# Patient Record
Sex: Male | Born: 1985 | Race: White | Hispanic: No | Marital: Married | Smoking: Current every day smoker
Health system: Southern US, Community
[De-identification: ages and names within clinical notes are randomized; demographics above are authoritative.]

---

## 2004-10-17 ENCOUNTER — Emergency Department: Payer: Self-pay | Admitting: Emergency Medicine

## 2007-11-18 ENCOUNTER — Emergency Department: Payer: Self-pay | Admitting: Emergency Medicine

## 2008-06-15 ENCOUNTER — Emergency Department: Payer: Self-pay | Admitting: Emergency Medicine

## 2009-08-09 IMAGING — CT CT ABD-PELV W/O CM
1 of 2 series · 15 of 32 positions shown, 19 images · non-contrast
Comparison: none

REASON FOR EXAM: (1) left  sided abd pain; (2) left lower quadrant abd
pain
COMMENTS:

[Series 2: stone · axial · 0.82mm/px · z∈[-479,-20]mm · 15 of 167 slices shown, 19 images]
[im 7/167  soft-tissue]
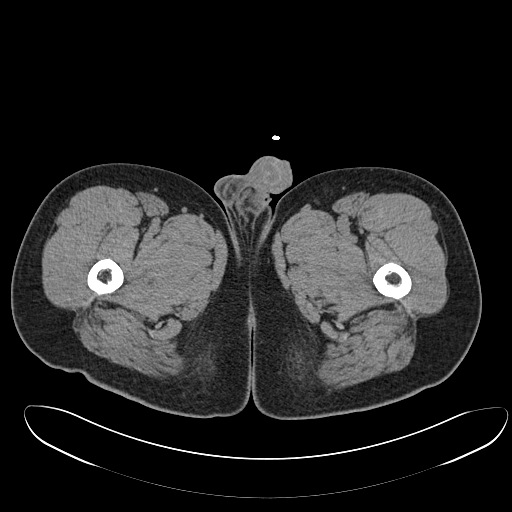
[im 7/167  bone]
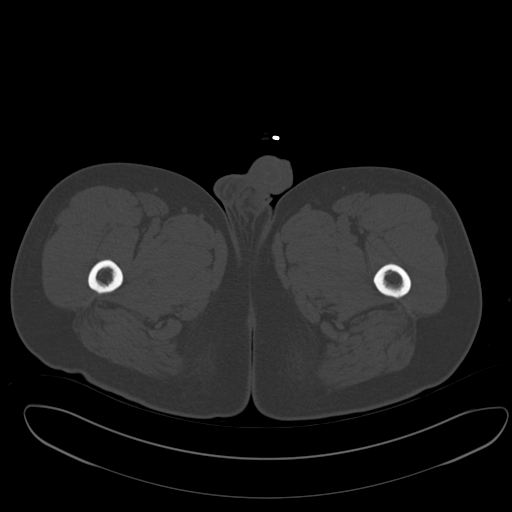
[im 21/167  soft-tissue]
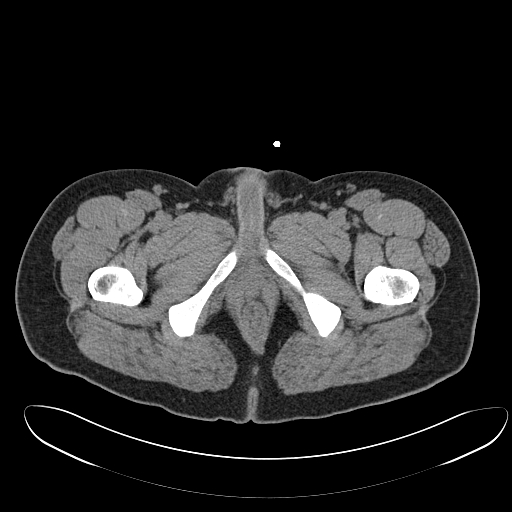
[im 35/167  soft-tissue]
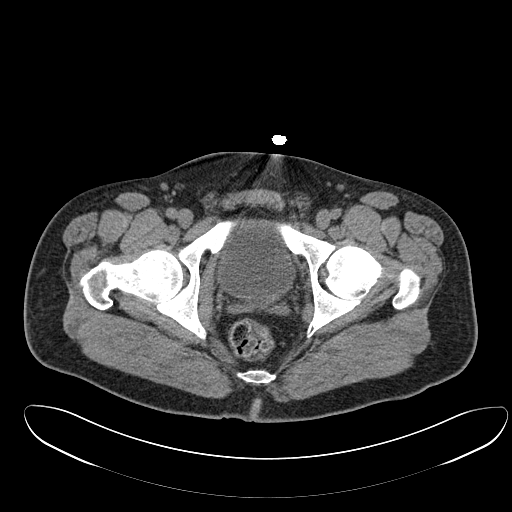
[im 49/167  soft-tissue]
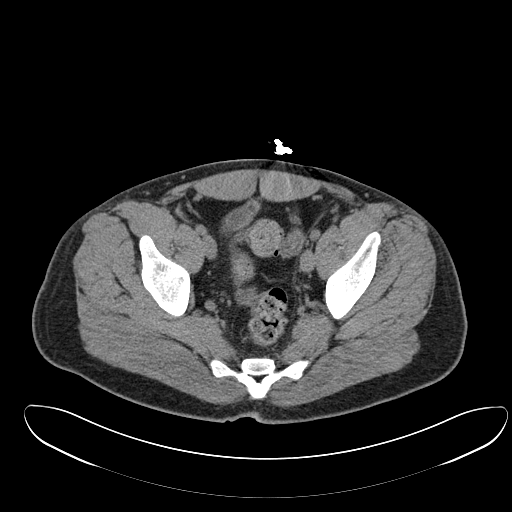
[im 56/167  soft-tissue]
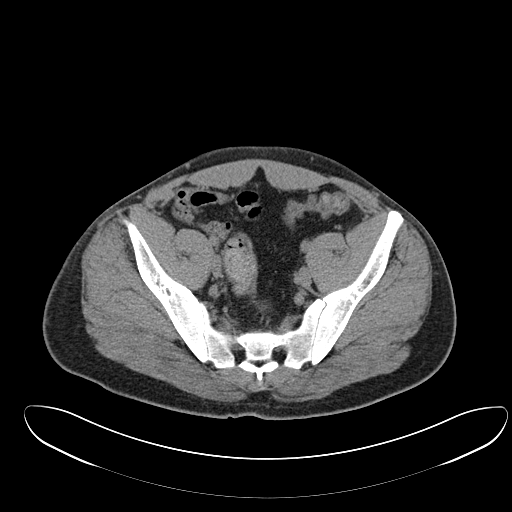
[im 70/167  soft-tissue]
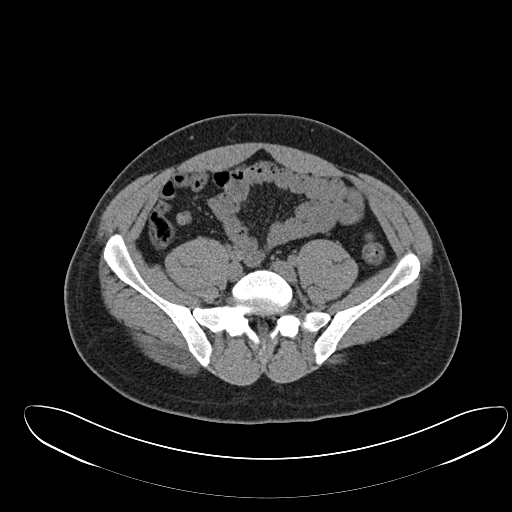
[im 84/167  soft-tissue]
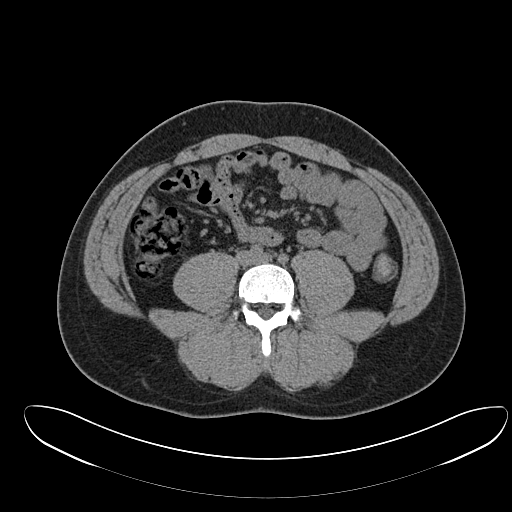
[im 97/167  soft-tissue]
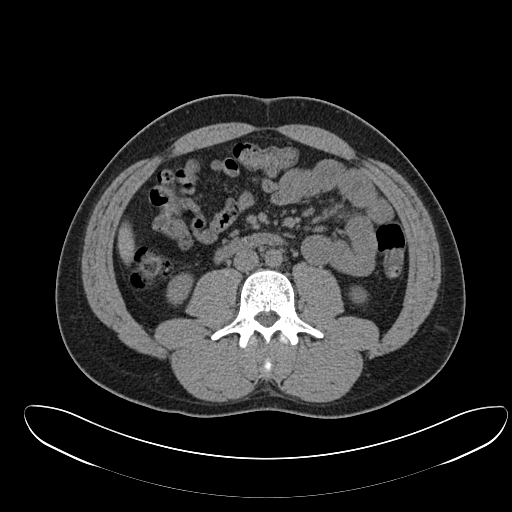
[im 111/167  soft-tissue]
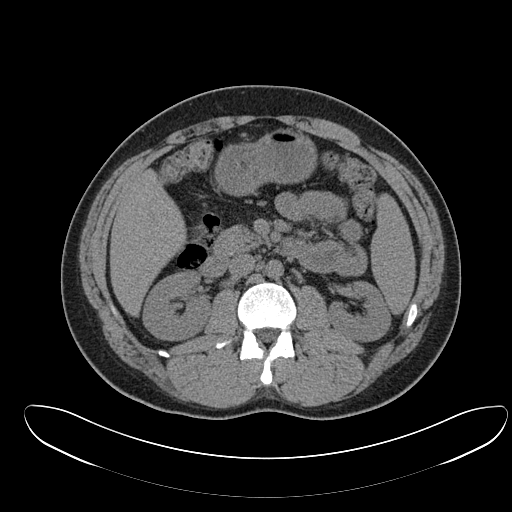
[im 111/167  bone]
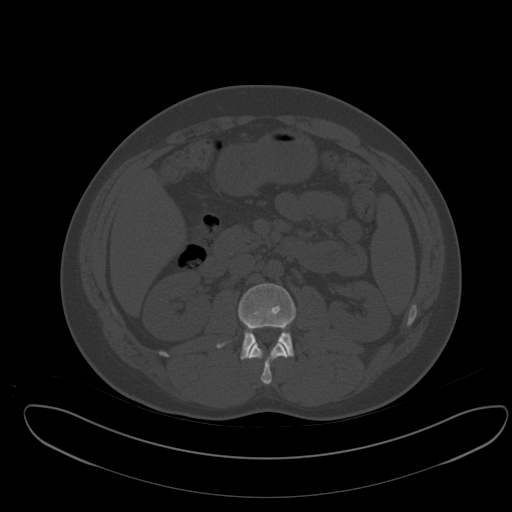
[im 118/167  soft-tissue]
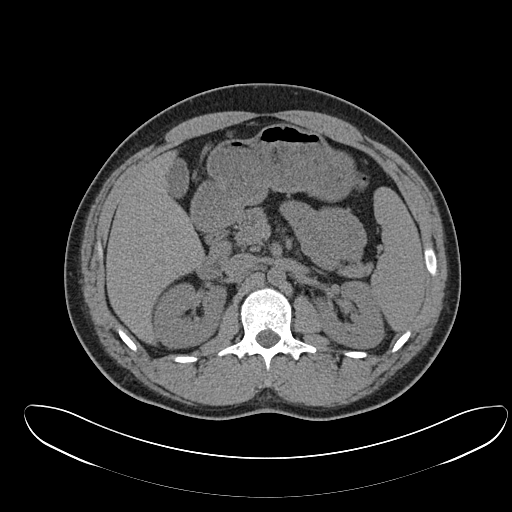
[im 132/167  soft-tissue]
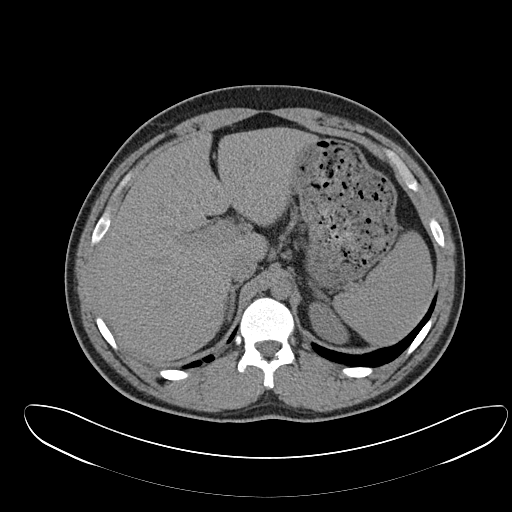
[im 139/167  lung]
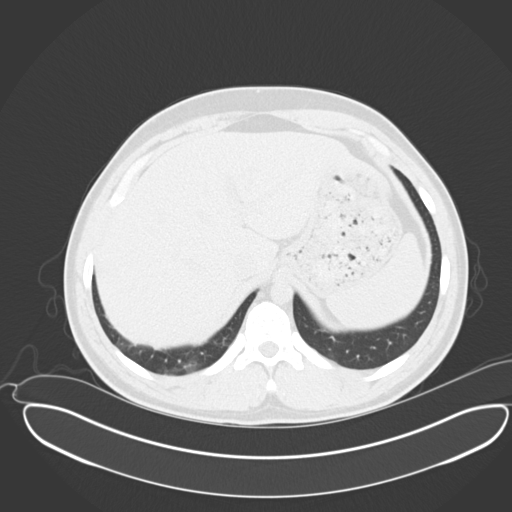
[im 146/167  soft-tissue]
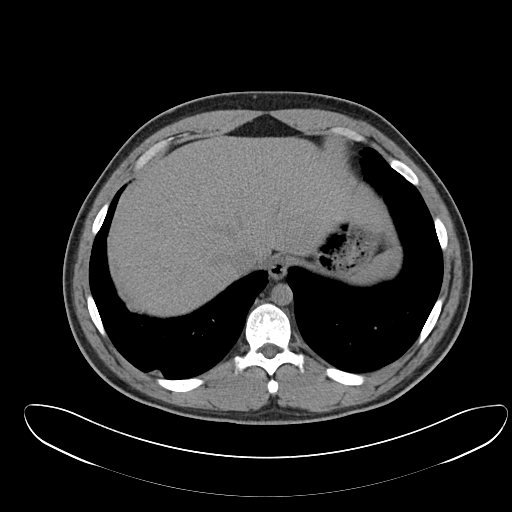
[im 146/167  lung]
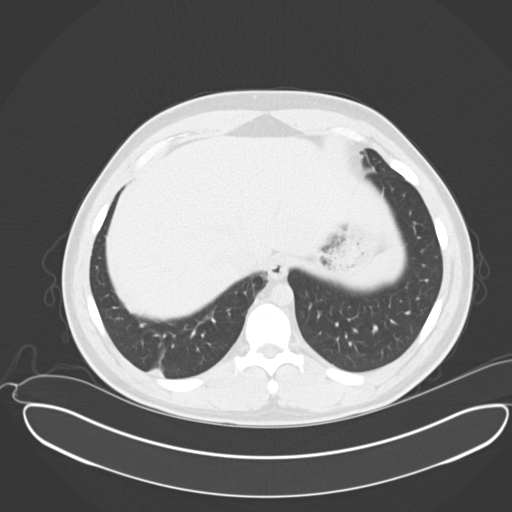
[im 153/167  lung]
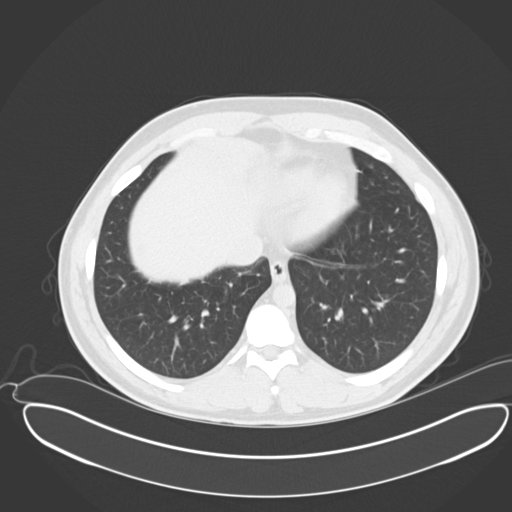
[im 160/167  soft-tissue]
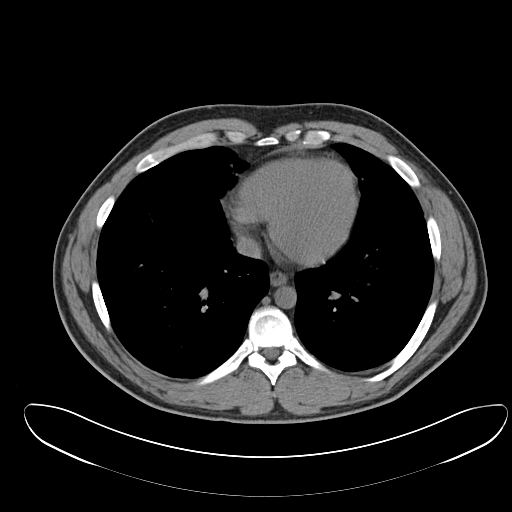
[im 160/167  lung]
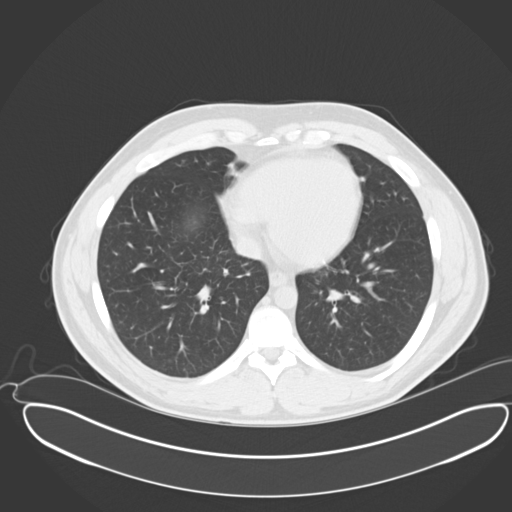

[15 of 32 positions shown; findings below may reference images not displayed]

PROCEDURE:     CT  - CT ABDOMEN AND PELVIS W[DATE]  [DATE]

RESULT:     Helical non-contrast 3 mm sections were obtained from the lung
bases through the pubic symphysis.

Evaluation of the lung bases demonstrates no gross abnormalities. Within the
limitations of a non-contrast CT, the liver, spleen adrenals and pancreas
are unremarkable. Evaluation of the kidneys demonstrates no evidence of
hydronephrosis, hydroureter, ureterolithiasis or nephrolithiasis. Small
subcentimeter mesenteric lymph nodes are identified as well as small
retroperitoneal lymph nodes. There is no CT evidence of bowel obstruction
nor an abdominal aortic aneurysm. The urinary bladder is distended with
urine.
IMPRESSION: 1. No CT evidence of renal calculus disease.

2. Small mesenteric and retroperitoneal lymph nodes which may represent an
element of mesenteric adenitis.

3. No further focal or acute abnormalities.

Dr. Flip of the Emergency Department was informed of these findings via a
preliminary faxed report on 06-15-2008 at [DATE] p.m. Central Standard Time.

## 2010-08-12 ENCOUNTER — Emergency Department: Payer: Self-pay | Admitting: Emergency Medicine

## 2011-06-18 ENCOUNTER — Emergency Department: Payer: Self-pay | Admitting: Emergency Medicine

## 2014-10-09 ENCOUNTER — Emergency Department: Payer: BLUE CROSS/BLUE SHIELD

## 2014-10-09 ENCOUNTER — Emergency Department
Admission: EM | Admit: 2014-10-09 | Discharge: 2014-10-09 | Disposition: A | Discharge status: Home / Self Care | Payer: BLUE CROSS/BLUE SHIELD | Attending: Emergency Medicine | Admitting: Emergency Medicine

## 2014-10-09 ENCOUNTER — Encounter: Payer: Self-pay | Admitting: Emergency Medicine

## 2014-10-09 DIAGNOSIS — Y998 Other external cause status: Secondary | ICD-10-CM | POA: Insufficient documentation

## 2014-10-09 DIAGNOSIS — S4991XA Unspecified injury of right shoulder and upper arm, initial encounter: Secondary | ICD-10-CM | POA: Diagnosis present

## 2014-10-09 DIAGNOSIS — W1849XA Other slipping, tripping and stumbling without falling, initial encounter: Secondary | ICD-10-CM | POA: Diagnosis not present

## 2014-10-09 DIAGNOSIS — Y9289 Other specified places as the place of occurrence of the external cause: Secondary | ICD-10-CM | POA: Insufficient documentation

## 2014-10-09 DIAGNOSIS — Y9301 Activity, walking, marching and hiking: Secondary | ICD-10-CM | POA: Diagnosis not present

## 2014-10-09 DIAGNOSIS — Z720 Tobacco use: Secondary | ICD-10-CM | POA: Insufficient documentation

## 2014-10-09 DIAGNOSIS — Y9389 Activity, other specified: Secondary | ICD-10-CM | POA: Insufficient documentation

## 2014-10-09 DIAGNOSIS — S40021A Contusion of right upper arm, initial encounter: Secondary | ICD-10-CM | POA: Insufficient documentation

## 2014-10-09 MED ORDER — NAPROXEN 500 MG PO TABS: 500.0000 mg | ORAL_TABLET | Freq: Two times a day (BID) | ORAL | Status: AC

## 2014-10-09 NOTE — ED Notes (Signed)
Pt reports slipping on steps last pm, pain to inner right upper arm, contusion noted

## 2014-10-09 NOTE — Discharge Instructions (Signed)
Contusion °A contusion is a deep bruise. Contusions happen when an injury causes bleeding under the skin. Signs of bruising include pain, puffiness (swelling), and discolored skin. The contusion may turn blue, purple, or yellow. °HOME CARE  °· Put ice on the injured area. °¨ Put ice in a plastic bag. °¨ Place a towel between your skin and the bag. °¨ Leave the ice on for 15-20 minutes, 03-04 times a day. °· Only take medicine as told by your doctor. °· Rest the injured area. °· If possible, raise (elevate) the injured area to lessen puffiness. °GET HELP RIGHT AWAY IF:  °· You have more bruising or puffiness. °· You have pain that is getting worse. °· Your puffiness or pain is not helped by medicine. °MAKE SURE YOU:  °· Understand these instructions. °· Will watch your condition. °· Will get help right away if you are not doing well or get worse. °Document Released: 10/26/2007 Document Revised: 08/01/2011 Document Reviewed: 03/14/2011 °Exit[employer]® Patient Information ©2015 [employer]. This information is not intended to replace advice given to you by your health care provider. Make sure you discuss any questions you have with your health care provider. ° °

## 2014-10-09 NOTE — ED Provider Notes (Signed)
[facility] Emergency Department Provider Note  ____________________________________________  Time seen: Approximately 10:08 AM  I have reviewed the triage vital signs and the nursing notes.   HISTORY  Chief Complaint Arm Injury    HPI Dennis Gonzalez is a 29 y.o. male who presents to the emergency department for right upper arm pain after slipping on a wet step last night at his house. He states he was walking his dog went to step up and lost his footing. He denies other injury as well as loss of consciousness.   History reviewed. No pertinent past medical history.  There are no active problems to display for this patient.   History reviewed. No pertinent past surgical history.  Current Outpatient Rx  Name  Route  Sig  Dispense  Refill  . naproxen (NAPROSYN) 500 MG tablet   Oral   Take 1 tablet (500 mg total) by mouth 2 (two) times daily with a meal.   60 tablet   2     Allergies Review of patient's allergies indicates no known allergies.  History reviewed. No pertinent family history.  Social History History  Substance Use Topics  . Smoking status: Current Every Day Smoker  . Smokeless tobacco: Not on file  . Alcohol Use: Yes    Review of Systems Constitutional: No recent illness. Eyes: No visual changes. ENT: No sore throat. Cardiovascular: Denies chest pain or palpitations. Respiratory: Denies shortness of breath. Gastrointestinal: No abdominal pain.  Genitourinary: Negative for dysuria. Musculoskeletal: Pain in right upper arm Skin: Negative for rash. Neurological: Negative for headaches, focal weakness or numbness. 10-point ROS otherwise negative.  ____________________________________________   PHYSICAL EXAM:  VITAL SIGNS: ED Triage Vitals  Enc Vitals Group     BP 10/09/14 0932 155/93 mmHg     Pulse Rate 10/09/14 0932 92     Resp 10/09/14 0932 20     Temp 10/09/14 0932 98 F (36.7 C)     Temp Source 10/09/14 0932  Oral     SpO2 10/09/14 0932 99 %     Weight 10/09/14 0932 160 lb (72.576 kg)     Height 10/09/14 0932 5\' 7"  (1.702 m)     Head Cir --      Peak Flow --      Pain Score 10/09/14 0933 8     Pain Loc --      Pain Edu? --      Excl. in GC? --     Constitutional: Alert and oriented. Well appearing and in no acute distress. Eyes: Conjunctivae are normal. EOMI. Head: Atraumatic. Nose: No congestion/rhinnorhea. Neck: No stridor.  Respiratory: Normal respiratory effort.   Musculoskeletal: Softball sized contusion noted to medial upper arm. Full range of motion of shoulder and elbow. Circulation: Pulse 2+ x 4. Neurologic:  Normal speech and language. No gross focal neurologic deficits are appreciated. Speech is normal. No gait instability. Skin:  Skin is warm, dry and intact. Atraumatic. Psychiatric: Mood and affect are normal. Speech and behavior are normal.  ____________________________________________   LABS (all labs ordered are listed, but only abnormal results are displayed)  Labs Reviewed - No data to display ____________________________________________  RADIOLOGY  Negative for fracture. ____________________________________________   PROCEDURES  Procedure(s) performed: Ace bandage applied for compression. CMS intact post application.   ____________________________________________   INITIAL IMPRESSION / ASSESSMENT AND PLAN / ED COURSE  Pertinent labs & imaging results that were available during my care of the patient were reviewed by me and  considered in my medical decision making (see chart for details).  Patient was advised follow-up with his primary care provider for symptoms that are not improving over the week. He was advised to return the emergency department for symptoms that change or worsen if he is unable schedule an appointment. ____________________________________________   FINAL CLINICAL IMPRESSION(S) / ED DIAGNOSES  Final diagnoses:  Contusion of  arm, right, initial encounter      Chinita PesterCari B Alta Shober, FNP 10/09/14 1742  Minna AntisKevin Paduchowski, MD 10/10/14 2143

## 2014-11-28 ENCOUNTER — Ambulatory Visit: Payer: Self-pay | Admitting: Family Medicine

## 2018-10-27 ENCOUNTER — Other Ambulatory Visit: Payer: Self-pay

## 2018-10-27 ENCOUNTER — Emergency Department
Admission: EM | Admit: 2018-10-27 | Discharge: 2018-10-27 | Disposition: A | Discharge status: Home / Self Care | Payer: Self-pay | Attending: Emergency Medicine | Admitting: Emergency Medicine

## 2018-10-27 ENCOUNTER — Emergency Department: Payer: Self-pay

## 2018-10-27 DIAGNOSIS — E860 Dehydration: Secondary | ICD-10-CM | POA: Insufficient documentation

## 2018-10-27 DIAGNOSIS — R079 Chest pain, unspecified: Secondary | ICD-10-CM

## 2018-10-27 DIAGNOSIS — R0789 Other chest pain: Secondary | ICD-10-CM | POA: Insufficient documentation

## 2018-10-27 DIAGNOSIS — F17210 Nicotine dependence, cigarettes, uncomplicated: Secondary | ICD-10-CM | POA: Insufficient documentation

## 2018-10-27 LAB — URINALYSIS, COMPLETE (UACMP) WITH MICROSCOPIC
Bacteria, UA: NONE SEEN
Bilirubin Urine: NEGATIVE
Glucose, UA: NEGATIVE mg/dL
Hgb urine dipstick: NEGATIVE
Ketones, ur: NEGATIVE mg/dL
Leukocytes,Ua: NEGATIVE
Nitrite: NEGATIVE
Protein, ur: 30 mg/dL — AB
Specific Gravity, Urine: 1.027 (ref 1.005–1.030)
pH: 5 (ref 5.0–8.0)

## 2018-10-27 LAB — BASIC METABOLIC PANEL
Anion gap: 7 (ref 5–15)
BUN: 11 mg/dL (ref 6–20)
CO2: 24 mmol/L (ref 22–32)
Calcium: 8.6 mg/dL — ABNORMAL LOW (ref 8.9–10.3)
Chloride: 108 mmol/L (ref 98–111)
Creatinine, Ser: 0.62 mg/dL (ref 0.61–1.24)
GFR calc Af Amer: >60 mL/min (ref >60)
GFR calc non Af Amer: >60 mL/min (ref >60)
Glucose, Bld: 101 mg/dL — ABNORMAL HIGH (ref 70–99)
Potassium: 3.6 mmol/L (ref 3.5–5.1)
Sodium: 139 mmol/L (ref 135–145)

## 2018-10-27 LAB — CBC
HCT: 43.5 % (ref 39.0–52.0)
Hemoglobin: 15.3 g/dL (ref 13.0–17.0)
MCH: 30.9 pg (ref 26.0–34.0)
MCHC: 35.2 g/dL (ref 30.0–36.0)
MCV: 87.9 fL (ref 80.0–100.0)
Platelets: 148 10*3/uL — ABNORMAL LOW (ref 150–400)
RBC: 4.95 MIL/uL (ref 4.22–5.81)
RDW: 11.9 % (ref 11.5–15.5)
WBC: 10.7 10*3/uL — ABNORMAL HIGH (ref 4.0–10.5)
nRBC: 0 % (ref 0.0–0.2)

## 2018-10-27 LAB — FIBRIN DERIVATIVES D-DIMER (ARMC ONLY): Fibrin derivatives D-dimer (ARMC): 343.79 ng/mL (FEU) (ref 0.00–499.00)

## 2018-10-27 LAB — URINE DRUG SCREEN, QUALITATIVE (ARMC ONLY)
Amphetamines, Ur Screen: POSITIVE — AB
Barbiturates, Ur Screen: NOT DETECTED
Benzodiazepine, Ur Scrn: POSITIVE — AB
Cannabinoid 50 Ng, Ur ~~LOC~~: POSITIVE — AB
Cocaine Metabolite,Ur ~~LOC~~: NOT DETECTED
MDMA (Ecstasy)Ur Screen: NOT DETECTED
Methadone Scn, Ur: NOT DETECTED
Opiate, Ur Screen: NOT DETECTED
Phencyclidine (PCP) Ur S: NOT DETECTED
Tricyclic, Ur Screen: NOT DETECTED

## 2018-10-27 LAB — TROPONIN I: Troponin I: <0.03 ng/mL (ref <0.03)

## 2018-10-27 MED ORDER — SODIUM CHLORIDE 0.9 % IV BOLUS: 1000.0000 mL | Freq: Once | INTRAVENOUS | Status: DC

## 2018-10-27 NOTE — ED Notes (Signed)
Pt's wife updated via phone about pt status.

## 2018-10-27 NOTE — ED Notes (Signed)
Pt took 2x 0.5mg  xanax last night and reports that he has been more stressed at work than normal.

## 2018-10-27 NOTE — ED Notes (Signed)
Pt given meal tray and water at this time 

## 2018-10-27 NOTE — Discharge Instructions (Addendum)
Your lab tests were all okay today.  Your symptoms appear to be due to dehydration from the hot weather lately.

## 2018-10-27 NOTE — ED Provider Notes (Signed)
[facility] Emergency Department Provider Note  ____________________________________________  Time seen: Approximately 7:45 PM  I have reviewed the triage vital signs and the nursing notes.   HISTORY  Chief Complaint Chest Pain    HPI Dennis Gonzalez is a 33 y.o. male with a history of anxiety who complains of weakness dizziness and syncope today, worse standing up.  Better walking.  He also complains of left-sided chest pain that is worse with movement and breathing.  Denies any trauma fevers or chills shortness of breath or cough.  Notably he has not been drinking much fluids, and temperature is been 90 degrees outside for the past several days.  He describes the pain as severe and nonradiating.     History reviewed. No pertinent past medical history. Anxiety  There are no active problems to display for this patient.    History reviewed. No pertinent surgical history.   Prior to Admission medications   Not on File  Xanax   Allergies Patient has no known allergies.   History reviewed. No pertinent family history.  Social History Social History   Tobacco Use  . Smoking status: Current Every Day Smoker  Substance Use Topics  . Alcohol use: Yes  . Drug use: Not on file    Review of Systems  Constitutional:   No fever or chills.  ENT:   No sore throat. No rhinorrhea. Cardiovascular: Positive as above chest pain and syncope. Respiratory:   No dyspnea or cough. Gastrointestinal:   Negative for abdominal pain, vomiting and diarrhea.  Musculoskeletal:   Negative for focal pain or swelling All other systems reviewed and are negative except as documented above in ROS and HPI.  ____________________________________________   PHYSICAL EXAM:  VITAL SIGNS: ED Triage Vitals  Enc Vitals Group     BP 10/27/18 1504 (!) 152/95     Pulse Rate 10/27/18 1504 (!) 104     Resp 10/27/18 1504 13     Temp 10/27/18 1504 98.5 F (36.9 C)     Temp  Source 10/27/18 1504 Oral     SpO2 10/27/18 1504 96 %     Weight 10/27/18 1506 250 lb (113.4 kg)     Height 10/27/18 1506 5\' 7"  (1.702 m)     Head Circumference --      Peak Flow --      Pain Score 10/27/18 1505 3     Pain Loc --      Pain Edu? --      Excl. in GC? --     Vital signs reviewed, nursing assessments reviewed.   Constitutional:   Alert and oriented. Non-toxic appearance. Eyes:   Conjunctivae are normal. EOMI. PERRL. ENT      Head:   Normocephalic and atraumatic.      Nose:   No congestion/rhinnorhea.       Mouth/Throat:   Dry mucous membranes, no pharyngeal erythema. No peritonsillar mass.       Neck:   No meningismus. Full ROM. Hematological/Lymphatic/Immunilogical:   No cervical lymphadenopathy. Cardiovascular:   Tachycardia heart rate 100. Symmetric bilateral radial and DP pulses.  No murmurs. Cap refill less than 2 seconds. Respiratory:   Normal respiratory effort without tachypnea/retractions. Breath sounds are clear and equal bilaterally. No wheezes/rales/rhonchi. Gastrointestinal:   Soft and nontender. Non distended. There is no CVA tenderness.  No rebound, rigidity, or guarding.  Musculoskeletal:   Normal range of motion in all extremities. No joint effusions.  No lower extremity tenderness.  No edema.  Left anterior and lateral chest wall exquisitely tender to the touch in the area of indicated pain reproducing his symptoms.  No bony point tenderness, no crepitus or inflammatory changes.  No bruising Neurologic:   Normal speech and language.  Motor grossly intact. No acute focal neurologic deficits are appreciated.  Skin:    Skin is warm, dry and intact. No rash noted.  No petechiae, purpura, or bullae.  ____________________________________________    LABS (pertinent positives/negatives) (all labs ordered are listed, but only abnormal results are displayed) Labs Reviewed  BASIC METABOLIC PANEL - Abnormal; Notable for the following components:      Result  Value   Glucose, Bld 101 (*)    Calcium 8.6 (*)    All other components within normal limits  CBC - Abnormal; Notable for the following components:   WBC 10.7 (*)    Platelets 148 (*)    All other components within normal limits  URINALYSIS, COMPLETE (UACMP) WITH MICROSCOPIC - Abnormal; Notable for the following components:   Color, Urine AMBER (*)    APPearance HAZY (*)    Protein, ur 30 (*)    All other components within normal limits  URINE DRUG SCREEN, QUALITATIVE ([facility] ONLY) - Abnormal; Notable for the following components:   Amphetamines, Ur Screen POSITIVE (*)    Cannabinoid 50 Ng, Ur [place] POSITIVE (*)    Benzodiazepine, Ur Scrn POSITIVE (*)    All other components within normal limits  TROPONIN I  FIBRIN DERIVATIVES D-DIMER ([facility] ONLY)   ____________________________________________   EKG  Interpreted by me Sinus tachycardia rate 104, normal axis intervals QRS ST segments and T waves  ____________________________________________    RADIOLOGY  Dg Chest Port 1 View  Result Date: 10/27/2018 CLINICAL DATA:  Syncope EXAM: PORTABLE CHEST 1 VIEW COMPARISON:  None. FINDINGS: Mild cardiomegaly. Both lungs are clear. The visualized skeletal structures are unremarkable. IMPRESSION: Mild cardiomegaly without acute abnormality of the lungs in AP portable projection. Electronically Signed   By: Lauralyn PrimesAlex  Bibbey M.D.   On: 10/27/2018 15:52    ____________________________________________   PROCEDURES Procedures  ____________________________________________  DIFFERENTIAL DIAGNOSIS   Dehydration, electrolyte abnormality, intoxication.  Low suspicion PE.  Unlikely ACS dissection pneumothorax pneumonia sepsis pericarditis.  CLINICAL IMPRESSION / ASSESSMENT AND PLAN / ED COURSE  Medications ordered in the ED: Medications  sodium chloride 0.9 % bolus 1,000 mL (1,000 mLs Intravenous Bolus 10/27/18 1542)    Pertinent labs & imaging results that were available during my care of the  patient were reviewed by me and considered in my medical decision making (see chart for details).  Dennis Gonzalez was evaluated in Emergency Department on 10/27/2018 for the symptoms described in the history of present illness. He was evaluated in the context of the global COVID-19 pandemic, which necessitated consideration that the patient might be at risk for infection with the SARS-CoV-2 virus that causes COVID-19. Institutional protocols and algorithms that pertain to the evaluation of patients at risk for COVID-19 are in a state of rapid change based on information released by regulatory bodies including the CDC and federal and state organizations. These policies and algorithms were followed during the patient's care in the ED.     Clinical Course as of Oct 27 1943  Sat Oct 27, 2018  1620 P/w reproducible chest wall pain. Likely dehydrated. Will give IVF. D dimer negative. Labs okay.     [PS]  1942 Labs all unremarkable.  UDS positive for amphetamines benzodiazepines and THC.  Vital signs are normal.  Heart rate improved from 10 5-70 with IV fluid hydration.  Stable for outpatient follow-up at this point.   [PS]  1943 Orthostatics are reassuring with some mild increase in heart rate initially with standing   [PS]    Clinical Course User Index [PS] Carrie Mew, MD     ____________________________________________   FINAL CLINICAL IMPRESSION(S) / ED DIAGNOSES    Final diagnoses:  Chest wall pain  Dehydration     ED Discharge Orders    None      Portions of this note were generated with dragon dictation software. Dictation errors may occur despite best attempts at proofreading.   Carrie Mew, MD 10/27/18 2000

## 2018-10-27 NOTE — ED Notes (Signed)
Pt had 324mg  aspirin, 100mg  fentanyl, and 4mg  zofran, and 532ml fluids.

## 2018-10-27 NOTE — ED Notes (Signed)
Pt remains asleep in NAD at this time. VSS. Pt awakens upon request but wants to continue sleeping and has not given a urine sample yet.

## 2018-10-27 NOTE — ED Notes (Signed)
Pt has eaten most of a meal tray and had some water with no trouble eating.

## 2018-10-27 NOTE — ED Triage Notes (Signed)
Pt comes from home after having multiple syncopal episodes with family and hypertensive with EMS. Pt works in Scient[employer], clinical (histocompatibility and immunogenetics). Pt Has not had much to drink today and also had a monster. Pt does have some peaked T waves in v2-v4. Pt c/o chest pain. Pt AOx4 at this time. Pt CBg was 84. Pt has hx of epilepsy since childhood.

## 2018-10-27 NOTE — ED Notes (Signed)
This RN enters room to get urine sample and pt had ripped IV out of arm accidentally while trying to urinate. Pt arm and bed cleaned up and bleeding is controlled at this time.

## 2018-10-29 ENCOUNTER — Other Ambulatory Visit: Payer: Self-pay

## 2018-10-29 ENCOUNTER — Encounter: Payer: Self-pay | Admitting: Emergency Medicine

## 2018-10-29 ENCOUNTER — Ambulatory Visit
Admission: EM | Admit: 2018-10-29 | Discharge: 2018-10-29 | Disposition: A | Discharge status: Home / Self Care | Payer: Self-pay | Attending: Family Medicine | Admitting: Family Medicine

## 2018-10-29 DIAGNOSIS — E860 Dehydration: Secondary | ICD-10-CM

## 2018-10-29 NOTE — ED Triage Notes (Signed)
Patient states that he needs a note to work.  Patient states that he was seen [facility] ED on Saturday and did not receive a work note there. Patient states that he is feeling better.

## 2018-10-29 NOTE — ED Provider Notes (Signed)
[facility]    CSN: [id number] Arrival date & time: 10/29/18  0854     History   Chief Complaint Chief Complaint  Patient presents with  . Return to Work Note    HPI Dennis Gonzalez is a 33 y.o. male.   33 yo male with a c/o dehydration over the weekend which is now resolved and requesting a note to go back to work. States he was seen in the ED over the weekend and received IV fluids. Has been keeping up with fluids orally. Denies any fevers, chills, vomiting.      History reviewed. No pertinent past medical history.  There are no active problems to display for this patient.   History reviewed. No pertinent surgical history.     Home Medications    Prior to Admission medications   Not on File    Family History History reviewed. No pertinent family history.  Social History Social History   Tobacco Use  . Smoking status: Current Every Day Smoker    Types: Cigarettes  . Smokeless tobacco: Never Used  Substance Use Topics  . Alcohol use: Yes  . Drug use: Yes    Types: Methamphetamines     Allergies   Patient has no known allergies.   Review of Systems Review of Systems   Physical Exam Triage Vital Signs ED Triage Vitals  Enc Vitals Group     BP 10/29/18 0906 122/87     Pulse Rate 10/29/18 0906 99     Resp 10/29/18 0906 16     Temp 10/29/18 0906 98.3 F (36.8 C)     Temp Source 10/29/18 0906 Oral     SpO2 10/29/18 0906 98 %     Weight 10/29/18 0903 160 lb (72.6 kg)     Height 10/29/18 0903 5\' 7"  (1.702 m)     Head Circumference --      Peak Flow --      Pain Score 10/29/18 0903 1     Pain Loc --      Pain Edu? --      Excl. in [place]? --    No data found.  Updated Vital Signs BP 122/87 (BP Location: Left Arm)   Pulse 99   Temp 98.3 F (36.8 C) (Oral)   Resp 16   Ht 5\' 7"  (1.702 m)   Wt 72.6 kg   SpO2 98%   BMI 25.06 kg/m   Visual Acuity Right Eye Distance:   Left Eye Distance:   Bilateral Distance:    Right Eye  Near:   Left Eye Near:    Bilateral Near:     Physical Exam Vitals signs and nursing note reviewed.  Constitutional:      General: He is not in acute distress.    Appearance: He is not toxic-appearing or diaphoretic.  Cardiovascular:     Rate and Rhythm: Normal rate.  Pulmonary:     Effort: Pulmonary effort is normal. No respiratory distress.  Abdominal:     Palpations: Abdomen is soft.  Neurological:     Mental Status: He is alert.      UC Treatments / Results  Labs (all labs ordered are listed, but only abnormal results are displayed) Labs Reviewed - No data to display  EKG None  Radiology Dg Chest [facility] 1 View  Result Date: 10/27/2018 CLINICAL DATA:  Syncope EXAM: PORTABLE CHEST 1 VIEW COMPARISON:  None. FINDINGS: Mild cardiomegaly. Both lungs are clear. The visualized skeletal structures are  unremarkable. IMPRESSION: Mild cardiomegaly without acute abnormality of the lungs in AP portable projection. Electronically Signed   By: Lauralyn PrimesAlex  Bibbey M.D.   On: 10/27/2018 15:52    Procedures Procedures (including critical care time)  Medications Ordered in UC Medications - No data to display  Initial Impression / Assessment and Plan / UC Course  I have reviewed the triage vital signs and the nursing notes.  Pertinent labs & imaging results that were available during my care of the patient were reviewed by me and considered in my medical decision making (see chart for details).      Final Clinical Impressions(s) / UC Diagnoses   Final diagnoses:  Dehydration  (resolved)   Discharge Instructions     Continue with increased fluids    ED Prescriptions    None      1. diagnosis reviewed with patient 2. Recommend supportive treatment with continued increased oral intake 3. Note given for work 4. Follow-up prn   Controlled Substance Prescriptions [place] Controlled Substance Registry consulted? Not Applicable   Payton Mccallumonty, Jeannemarie Sawaya, MD 10/29/18 1100

## 2018-10-29 NOTE — Discharge Instructions (Signed)
Continue with increased fluids. 

## 2019-10-22 ENCOUNTER — Emergency Department
Admission: EM | Admit: 2019-10-22 | Discharge: 2019-11-21 | Disposition: E | Payer: Self-pay | Attending: Emergency Medicine | Admitting: Emergency Medicine

## 2019-10-22 DIAGNOSIS — I469 Cardiac arrest, cause unspecified: Secondary | ICD-10-CM | POA: Insufficient documentation

## 2019-10-22 DIAGNOSIS — F17210 Nicotine dependence, cigarettes, uncomplicated: Secondary | ICD-10-CM | POA: Insufficient documentation

## 2019-10-22 MED ORDER — EPINEPHRINE 1 MG/10ML IJ SOSY
PREFILLED_SYRINGE | INTRAMUSCULAR | Status: AC | PRN
  Administered 2019-10-22 (×3): 1 via INTRAVENOUS

## 2019-10-22 MED ORDER — ATROPINE SULFATE 1 MG/ML IJ SOLN
INTRAMUSCULAR | Status: AC | PRN
  Administered 2019-10-22: 1 mg via INTRAVENOUS

## 2019-10-22 MED ORDER — SODIUM BICARBONATE 8.4 % IV SOLN
INTRAVENOUS | Status: AC | PRN
  Administered 2019-10-22: 100 meq via INTRAVENOUS

## 2019-10-23 LAB — GLUCOSE, CAPILLARY: Glucose-Capillary: 213 mg/dL — ABNORMAL HIGH (ref 70–99)

## 2019-11-21 NOTE — ED Notes (Signed)
Body bag/tags applied, paperwork on chest.

## 2019-11-21 NOTE — ED Notes (Addendum)
Suitable for tissue analysis per [facility].   Referral number: [id number] Felipa Eth

## 2019-11-21 NOTE — ED Notes (Signed)
PT expired at 1533.

## 2019-11-21 NOTE — ED Triage Notes (Signed)
Pt arrived emergency [facility] from home after being found in car unresponsive. Pt went for break and did not return to work when coworkers got concerned and looked for him. Cyanotic on ems arrival.

## 2019-11-21 NOTE — Progress Notes (Signed)
CH paged to ED for incoming 34yo. pt. receiving CPR; when [facility] arrived at pt.'s rm. medical team continuing CPR.  No family present per. ED employee.  CH remained nearby to monitor situation; at length, CH entered rm. and was told pt. had died.  MD has attempted to contact spouse, the only emergency contact on file; no answer and unable to leave message.  CH remains available as needed.    2019/11/11 1500  Clinical Encounter Type  Visited With Health care provider;Patient not available  Visit Type Initial;Code;Death;ED  Referral From Nurse

## 2019-11-21 NOTE — ED Provider Notes (Signed)
Department of Emergency Medicine   Code Blue CONSULT NOTE  Chief Complaint: Cardiac arrest/unresponsive   Level V Caveat: Unresponsive  History of present illness: Patient brought to the ER by EMS status post cardiac arrest. According to the report he had gone to his car to take a break from work. He was then not seen for some time. He had been complaining of chest pain throughout the day according to EMS. EMS found him pulseless and apneic. He had received 3 mg of epinephrine, was intubated with a King airway and was receiving chest compressions until arrival here.  ROS: Unable to obtain, Level V caveat  Scheduled Meds: Continuous Infusions: PRN Meds:. No past medical history on file. No past surgical history on file. Social History   Socioeconomic History  . Marital status: Married    Spouse name: Not on file  . Number of children: Not on file  . Years of education: Not on file  . Highest education level: Not on file  Occupational History  . Not on file  Tobacco Use  . Smoking status: Current Every Day Smoker    Types: Cigarettes  . Smokeless tobacco: Never Used  Substance and Sexual Activity  . Alcohol use: Yes  . Drug use: Yes    Types: Methamphetamines  . Sexual activity: Not on file  Other Topics Concern  . Not on file  Social History Narrative  . Not on file   Social Determinants of Health   Financial Resource Strain:   . Difficulty of Paying Living Expenses:   Food Insecurity:   . Worried About Charity fundraiser in the Last Year:   . Arboriculturist in the Last Year:   Transportation Needs:   . Film/video editor (Medical):   Marland Kitchen Lack of Transportation (Non-Medical):   Physical Activity:   . Days of Exercise per Week:   . Minutes of Exercise per Session:   Stress:   . Feeling of Stress :   Social Connections:   . Frequency of Communication with Friends and Family:   . Frequency of Social Gatherings with Friends and Family:   . Attends Religious  Services:   . Active Member of Clubs or Organizations:   . Attends Archivist Meetings:   Marland Kitchen Marital Status:   Intimate Partner Violence:   . Fear of Current or Ex-Partner:   . Emotionally Abused:   Marland Kitchen Physically Abused:   . Sexually Abused:    No Known Allergies  Last set of Vital Signs (not current) There were no vitals filed for this visit.    Physical Exam Gen: unresponsive Eyes: Pupils fixed and dilated Cardiovascular: pulseless  Resp: apneic. Breath sounds equal bilaterally with bagging  Abd: nondistended  Neuro: GCS 3, unresponsive to pain  HEENT: No blood in posterior pharynx, gag reflex absent  Neck: No crepitus  Musculoskeletal: No deformity  Skin: warm  Procedures  INTUBATION Performed by: Laurence Aly Required items: required blood products, implants, devices, and special equipment available Patient identity confirmed: provided demographic data and hospital-assigned identification number Time out: Immediately prior to procedure a "time out" was called to verify the correct patient, procedure, equipment, support staff and site/side marked as required. Indications: Post arrest Intubation method: Glide scope Preoxygenation: 100% BVM Sedatives: None Paralytic: None Tube Size: 7.5 cuffed Post-procedure assessment: chest rise and ETCO2 monitor Breath sounds: equal and absent over the epigastrium Tube secured by Respiratory Therapy Patient tolerated the procedure well with no immediate  complications.  OG placement  Date/Time: 10-28-19 3:42 PM Performed by: Emily Filbert, MD Authorized by: Emily Filbert, MD  Consent: The procedure was performed in an emergent situation. Local anesthesia used: no  Anesthesia: Local anesthesia used: no  Sedation: Patient sedated: no    CRITICAL CARE Performed by: Ulice Dash Total critical care time: 15 Critical care time was exclusive of separately billable procedures and  treating other patients. Critical care was necessary to treat or prevent imminent or life-threatening deterioration. Critical care was time spent personally by me on the following activities: development of treatment plan with patient and/or surrogate as well as nursing, discussions with consultants, evaluation of patient's response to treatment, examination of patient, obtaining history from patient or surrogate, ordering and performing treatments and interventions, ordering and review of laboratory studies, ordering and review of radiographic studies, pulse oximetry and re-evaluation of patient's condition.  Cardiopulmonary Resuscitation (CPR) Procedure Note Directed/Performed by: Ulice Dash I personally directed ancillary staff and/or performed CPR in an effort to regain return of spontaneous circulation and to maintain cardiac, neuro and systemic perfusion.    Medical Decision making  Arrhythmia, MI, overdose, intoxication, suicide  Assessment and Plan  Cardiopulmonary arrest  Patient status post cardiopulmonary arrest from work. Possible overdose although this is unknown. In total we had attempted resuscitation for over an hour. Definitive ET tube had been placed. Patient had good peripheral IV access with multiple doses of epinephrine, atropine, bicarb and Narcan given without any improvement. He remained in asystole and time of death was called at 3:35 PM.    Emily Filbert, MD 2019/10/28 1544

## 2019-11-21 DEATH — deceased

## 2019-12-21 IMAGING — DX PORTABLE CHEST - 1 VIEW
1 series · 1 of 1 positions shown · non-contrast
Comparison: None.

CLINICAL DATA: Syncope

EXAM:
PORTABLE CHEST 1 VIEW

[chest ap]
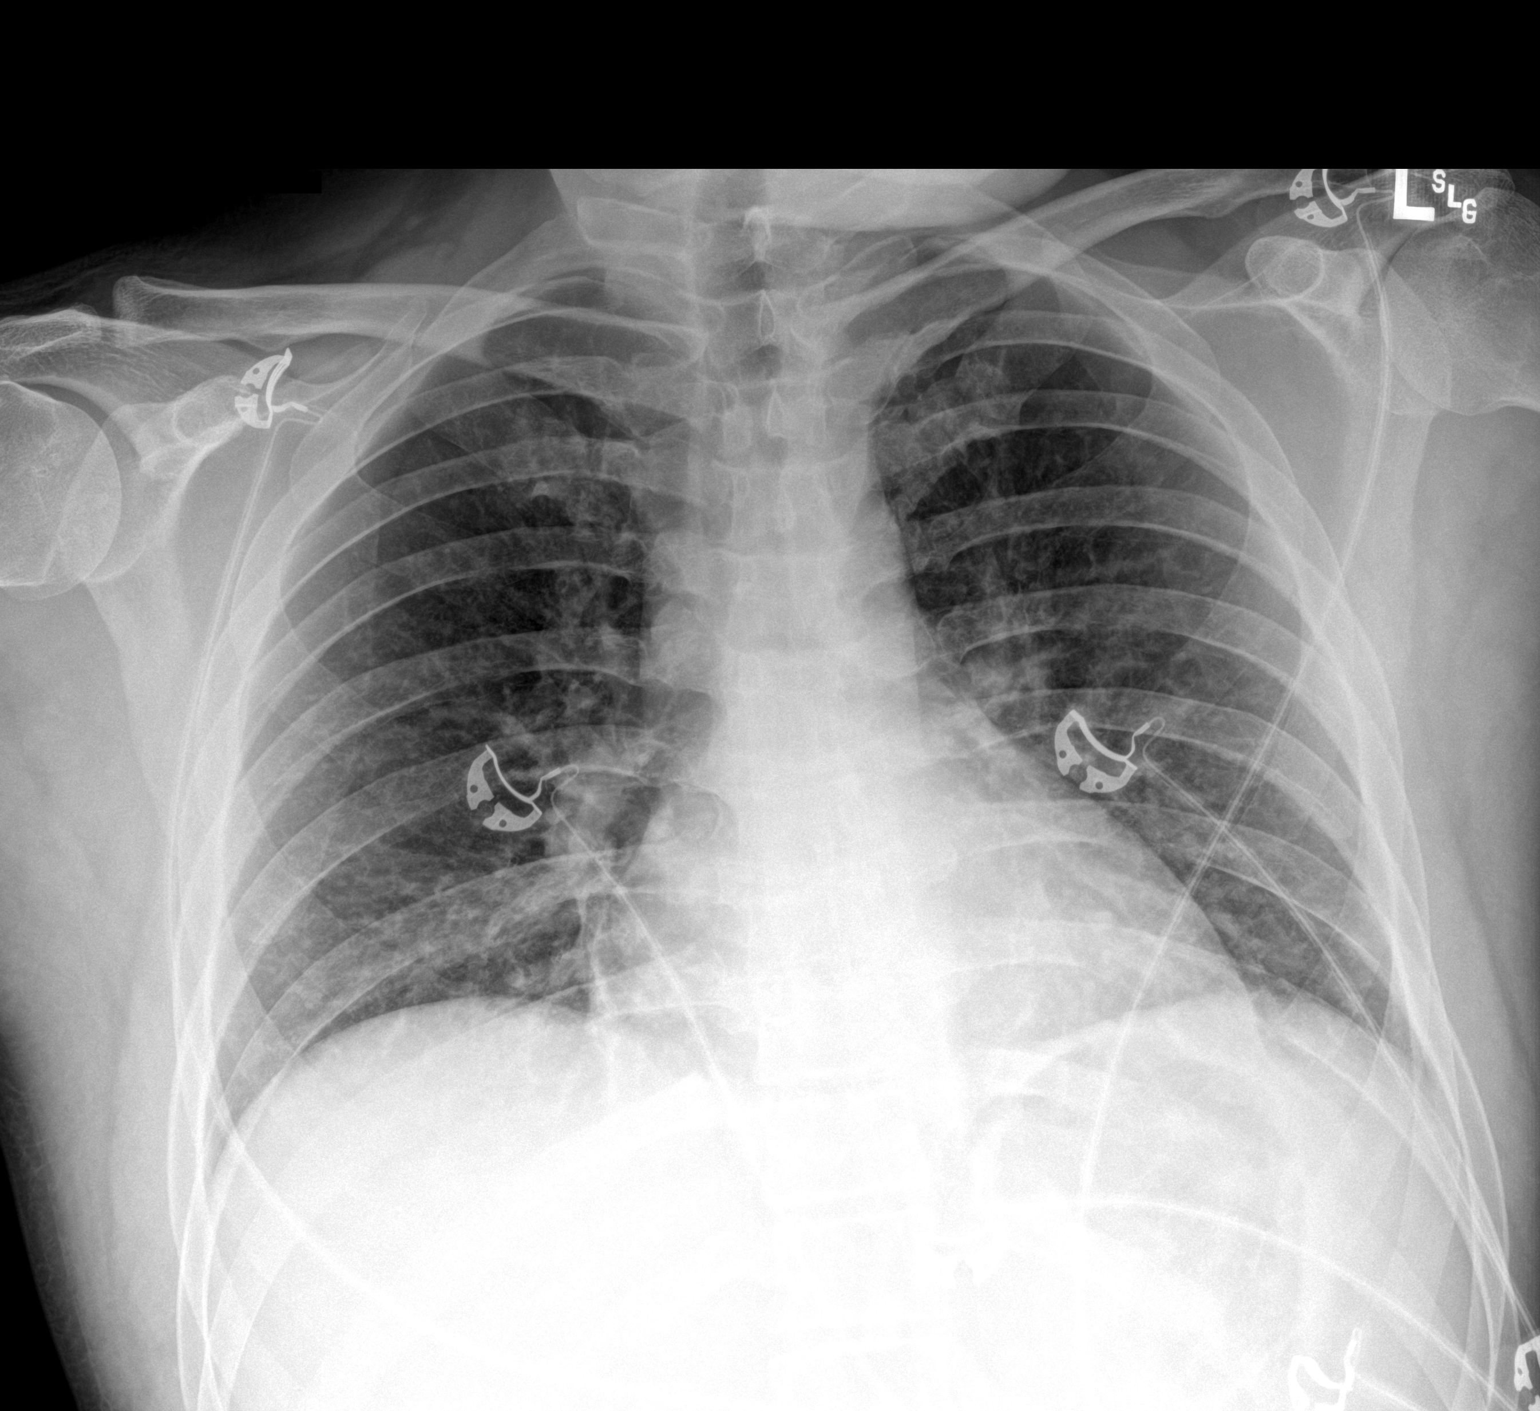

[1 of 1 positions shown; findings below may reference images not displayed]

FINDINGS: Mild cardiomegaly. Both lungs are clear. The visualized skeletal
structures are unremarkable.
IMPRESSION: Mild cardiomegaly without acute abnormality of the lungs in AP
portable projection.
